# Patient Record
Sex: Female | Born: 2000 | Race: White | Hispanic: No | Marital: Single | Smoking: Never smoker
Health system: Southern US, Community
[De-identification: ages and names within clinical notes are randomized; demographics above are authoritative.]

## PROBLEM LIST (undated history)

## (undated) DIAGNOSIS — L509 Urticaria, unspecified: Secondary | ICD-10-CM

## (undated) DIAGNOSIS — N200 Calculus of kidney: Secondary | ICD-10-CM

## (undated) DIAGNOSIS — F411 Generalized anxiety disorder: Secondary | ICD-10-CM

## (undated) DIAGNOSIS — H9193 Unspecified hearing loss, bilateral: Secondary | ICD-10-CM

## (undated) DIAGNOSIS — H903 Sensorineural hearing loss, bilateral: Secondary | ICD-10-CM

## (undated) DIAGNOSIS — F909 Attention-deficit hyperactivity disorder, unspecified type: Secondary | ICD-10-CM

## (undated) HISTORY — DX: Calculus of kidney: N20.0

## (undated) HISTORY — DX: Unspecified hearing loss, bilateral: H91.93

## (undated) HISTORY — DX: Urticaria, unspecified: L50.9

## (undated) HISTORY — DX: Generalized anxiety disorder: F41.1

## (undated) HISTORY — DX: Attention-deficit hyperactivity disorder, unspecified type: F90.9

## (undated) HISTORY — DX: Sensorineural hearing loss, bilateral: H90.3

---
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
## 2021-03-11 NOTE — Progress Notes (Signed)
Rebekah Parker is a 20 y.o. female here for lower back pain.  History of Present Illness:   Chief Complaint  Patient presents with  . Back Pain    Lower right sided back pain that started 5 days ago Has a hx of kidney infections     HPI  Back Pain Rebekah Parker presents with concerns of a kidney infection following lower right sided back pain. According to pt, the pain begins in the lower right side of her back and radiates to the right side of her pelvis. This pain has been onset for five days and is worsened by movement or laying down. Rebekah Parker says she has been pushing more fluids and taking NSAIDs such as advil which has provided no relief.   Pt does have a hx of kidney infections, having two in the past. She expressed that she is willing to undergo a CT scan if deemed necessary. Denies frequent urination, burning with urination, vaginal discharge, hematuria,  fever, chills, diarrhea, or recent sexual encounter.   Past Medical History:  Diagnosis Date  . ADHD   . Bilateral hearing loss 06/16/2018  . GAD (generalized anxiety disorder) 06/16/2018  . Hearing impaired person, bilateral   . Sensorineural hearing loss (SNHL) of both ears 06/16/2018   genetic     Social History   Tobacco Use  . Smoking status: Never  . Smokeless tobacco: Never  Vaping Use  . Vaping Use: Never used  Substance Use Topics  . Alcohol use: Never  . Drug use: Never    History reviewed. No pertinent surgical history.  Family History  Problem Relation Age of Onset  . Asthma Mother   . Miscarriages / [place] Mother   . Hypertension Father   . Asthma Maternal Grandmother   . Hearing loss Maternal Grandfather   . Heart attack Maternal Grandfather   . Hyperlipidemia Maternal Grandfather   . Stroke Maternal Grandfather   . Diabetes Paternal Grandmother   . Hearing loss Paternal Grandmother     No Known Allergies  Current Medications:   Current Outpatient Medications:  .   amoxicillin-clavulanate (AUGMENTIN) 875-125 MG tablet, Take 1 tablet by mouth 2 (two) times daily., Disp: 20 tablet, Rfl: 0 .  Amphet-Dextroamphet 3-Bead ER 25 MG CP24, TAKE 1 CAPSULE BY MOUTH EVERY DAY IN THE MORNING, Disp: , Rfl:  .  sertraline (ZOLOFT) 50 MG tablet, Take by mouth., Disp: , Rfl:    Review of Systems:   ROS Negative unless otherwise specified per HPI.  Vitals:   Vitals:   03/12/21 0850  BP: 108/72  Pulse: 89  Temp: 97.9 F (36.6 C)  TempSrc: Temporal  SpO2: 99%  Weight: 171 lb 2 oz (77.6 kg)  Height: 5' (1.524 m)     Body mass index is 33.42 kg/m.  Physical Exam:   Physical Exam Vitals and nursing note reviewed.  Constitutional:      General: She is not in acute distress.    Appearance: She is well-developed. She is not ill-appearing or toxic-appearing.  Cardiovascular:     Rate and Rhythm: Normal rate and regular rhythm.     Pulses: Normal pulses.     Heart sounds: Normal heart sounds, S1 normal and S2 normal.  Pulmonary:     Effort: Pulmonary effort is normal.     Breath sounds: Normal breath sounds.  Abdominal:     General: Abdomen is flat. Bowel sounds are normal.     Palpations: Abdomen is soft.  Tenderness: There is abdominal tenderness in the right lower quadrant. There is right CVA tenderness. There is no guarding or rebound.  Skin:    General: Skin is warm and dry.  Neurological:     Mental Status: She is alert.     GCS: GCS eye subscore is 4. GCS verbal subscore is 5. GCS motor subscore is 6.  Psychiatric:        Speech: Speech normal.        Behavior: Behavior normal. Behavior is cooperative.   Results for orders placed or performed in visit on 03/12/21  POCT urinalysis dipstick  Result Value Ref Range   Color, UA yellow    Clarity, UA cloudy    Glucose, UA Negative Negative   Bilirubin, UA neg    Ketones, UA neg    Spec Grav, UA 1.025 1.010 - 1.025   Blood, UA neg    pH, UA 6.0 5.0 - 8.0   Protein, UA Negative Negative    Urobilinogen, UA 0.2 0.2 or 1.0 E.U./dL   Nitrite, UA neg    Leukocytes, UA Trace (A) Negative   Appearance     Odor    POCT urine pregnancy  Result Value Ref Range   Preg Test, Ur Negative Negative     Assessment and Plan:   Right-sided low back pain without sciatica, unspecified chronicity Urine pregnancy test negative UA shows leukocytes Given hx of pyelo and sx consistent with this -- will treat with rocephin injection (1 g injection today, patient tolerated well) Start oral augmentin (did not have good results with cipro in the past per patient report -- I do not have prior culture results to review) Patient and mom are asking for CT Renal Stone study per patient's request Follow-up based on results and clinical symptoms, worsening precautions advised  I,Havlyn C Ratchford,acting as a scribe for [employer], PA.,have documented all relevant documentation on the behalf of Rebekah Coke, PA,as directed by  Rebekah Coke, PA while in the presence of Rebekah Parker, [place].  I, Rebekah Parker, [place], have reviewed all documentation for this visit. The documentation on 03/12/21 for the exam, diagnosis, procedures, and orders are all accurate and complete.  Rebekah Coke, PA-C

## 2021-03-12 ENCOUNTER — Encounter (HOSPITAL_BASED_OUTPATIENT_CLINIC_OR_DEPARTMENT_OTHER): Payer: Self-pay

## 2021-03-12 ENCOUNTER — Other Ambulatory Visit: Payer: Self-pay

## 2021-03-12 ENCOUNTER — Ambulatory Visit (HOSPITAL_BASED_OUTPATIENT_CLINIC_OR_DEPARTMENT_OTHER)
Admission: RE | Admit: 2021-03-12 | Discharge: 2021-03-12 | Disposition: A | Discharge status: Home / Self Care | Payer: 59 | Source: Ambulatory Visit | Attending: Physician Assistant | Admitting: Physician Assistant

## 2021-03-12 ENCOUNTER — Ambulatory Visit: Payer: 59 | Admitting: Physician Assistant

## 2021-03-12 ENCOUNTER — Encounter: Payer: Self-pay | Admitting: Physician Assistant

## 2021-03-12 ENCOUNTER — Other Ambulatory Visit: Payer: Self-pay | Admitting: *Deleted

## 2021-03-12 VITALS — BP 108/72 | HR 89 | Temp 97.9°F | Ht 60.0 in | Wt 171.1 lb

## 2021-03-12 DIAGNOSIS — R1031 Right lower quadrant pain: Secondary | ICD-10-CM

## 2021-03-12 DIAGNOSIS — M545 Low back pain, unspecified: Secondary | ICD-10-CM | POA: Diagnosis not present

## 2021-03-12 DIAGNOSIS — N200 Calculus of kidney: Secondary | ICD-10-CM

## 2021-03-12 LAB — CBC WITH DIFFERENTIAL/PLATELET
Basophils Absolute: 0 10*3/uL (ref 0.0–0.1)
Basophils Relative: 0.4 % (ref 0.0–3.0)
Eosinophils Absolute: 0.1 10*3/uL (ref 0.0–0.7)
Eosinophils Relative: 1.2 % (ref 0.0–5.0)
HCT: 36.6 % (ref 36.0–49.0)
Hemoglobin: 12.4 g/dL (ref 12.0–16.0)
Lymphocytes Relative: 33.2 % (ref 24.0–48.0)
Lymphs Abs: 2.1 10*3/uL (ref 0.7–4.0)
MCHC: 34 g/dL (ref 31.0–37.0)
MCV: 88.4 fl (ref 78.0–98.0)
Monocytes Absolute: 0.7 10*3/uL (ref 0.1–1.0)
Monocytes Relative: 10.6 % (ref 3.0–12.0)
Neutro Abs: 3.4 10*3/uL (ref 1.4–7.7)
Neutrophils Relative %: 54.6 % (ref 43.0–71.0)
Platelets: 199 10*3/uL (ref 150.0–575.0)
RBC: 4.13 Mil/uL (ref 3.80–5.70)
RDW: 13.5 % (ref 11.4–15.5)
WBC: 6.2 10*3/uL (ref 4.5–13.5)

## 2021-03-12 LAB — POCT URINALYSIS DIPSTICK
Bilirubin, UA: NEGATIVE
Blood, UA: NEGATIVE
Glucose, UA: NEGATIVE
Ketones, UA: NEGATIVE
Nitrite, UA: NEGATIVE
Protein, UA: NEGATIVE
Spec Grav, UA: 1.025 (ref 1.010–1.025)
Urobilinogen, UA: 0.2 E.U./dL
pH, UA: 6 (ref 5.0–8.0)

## 2021-03-12 LAB — BASIC METABOLIC PANEL
BUN: 11 mg/dL (ref 6–23)
CO2: 23 mEq/L (ref 19–32)
Calcium: 9.6 mg/dL (ref 8.4–10.5)
Chloride: 106 mEq/L (ref 96–112)
Creatinine, Ser: 0.72 mg/dL (ref 0.40–1.20)
GFR: 121.62 mL/min (ref >60.00)
Glucose, Bld: 89 mg/dL (ref 70–99)
Potassium: 4.1 mEq/L (ref 3.5–5.1)
Sodium: 136 mEq/L (ref 135–145)

## 2021-03-12 LAB — POCT URINE PREGNANCY: Preg Test, Ur: NEGATIVE

## 2021-03-12 MED ORDER — AMOXICILLIN-POT CLAVULANATE 875-125 MG PO TABS: 1.0000 | ORAL_TABLET | Freq: Two times a day (BID) | ORAL | 0 refills | Status: DC

## 2021-03-12 MED ORDER — CEFTRIAXONE SODIUM 1 G IJ SOLR
1.0000 g | Freq: Once | INTRAMUSCULAR | Status: AC
  Administered 2021-03-12: 1 g via INTRAMUSCULAR

## 2021-03-12 NOTE — Addendum Note (Signed)
Addended by: Jobe Gibbon on: 03/12/2021 11:41 AM   Modules accepted: Orders

## 2021-03-12 NOTE — Patient Instructions (Signed)
It was great to see you!  Rocephin injection today Augmentin oral antibiotic -- please start this  We are going to obtain CT scan for further evaluation  General instructions Make sure you: Pee until your bladder is empty. Do not hold pee for a long time. Empty your bladder after sex. Wipe from front to back after pooping if you are a female. Use each tissue one time when you wipe. Drink enough fluid to keep your pee pale yellow. Keep all follow-up visits as told by your doctor. This is important. Contact a doctor if: You do not get better after 1-2 days. Your symptoms go away and then come back. Get help right away if: You have very bad back pain. You have very bad pain in your lower belly. You have a fever. You are sick to your stomach (nauseous). You are throwing up.   Take care,  Jarold Motto PA-C

## 2021-03-13 LAB — URINE CULTURE
MICRO NUMBER:: 12726069
SPECIMEN QUALITY:: ADEQUATE

## 2021-05-22 ENCOUNTER — Ambulatory Visit (INDEPENDENT_AMBULATORY_CARE_PROVIDER_SITE_OTHER): Payer: 59 | Admitting: Family Medicine

## 2021-05-22 ENCOUNTER — Other Ambulatory Visit: Payer: Self-pay

## 2021-05-22 ENCOUNTER — Encounter: Payer: Self-pay | Admitting: Family Medicine

## 2021-05-22 VITALS — BP 119/80 | HR 43 | Temp 98.0°F | Ht 63.0 in | Wt 166.0 lb

## 2021-05-22 DIAGNOSIS — F411 Generalized anxiety disorder: Secondary | ICD-10-CM | POA: Diagnosis not present

## 2021-05-22 DIAGNOSIS — Z7185 Encounter for immunization safety counseling: Secondary | ICD-10-CM

## 2021-05-22 DIAGNOSIS — N200 Calculus of kidney: Secondary | ICD-10-CM | POA: Diagnosis not present

## 2021-05-22 DIAGNOSIS — F902 Attention-deficit hyperactivity disorder, combined type: Secondary | ICD-10-CM

## 2021-05-22 DIAGNOSIS — N390 Urinary tract infection, site not specified: Secondary | ICD-10-CM

## 2021-05-22 DIAGNOSIS — Z0000 Encounter for general adult medical examination without abnormal findings: Secondary | ICD-10-CM | POA: Diagnosis not present

## 2021-05-22 DIAGNOSIS — N920 Excessive and frequent menstruation with regular cycle: Secondary | ICD-10-CM

## 2021-05-22 NOTE — Progress Notes (Signed)
Subjective  Chief Complaint  Patient presents with   Annual Exam    Fasting    HPI: Rebekah Parker is a 21 y.o. female who presents to [facility] today for a Female Wellness Visit.   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  Living at home.  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  Dating but not yet sexually active ever.  Regular heavy menstrual cycles.  Recent CBC without anemia. No high risk behaviors identified. ADD and GAD: Continues with [facility] attention specialist and psychiatry.  Adjusting medications.  No longer on sertraline, change recently to Wellbutrin.  Fairly stable. Reviewed recent office visit for flank pain and urology visit for recurrent UTIs.  CT scan showed nonobstructing small nephrolithiasis.  Treated for UTI, recurrent and now on Macrobid suppression.  Has follow-up in 6 months.  Assessment  1. Annual physical exam   2. GAD (generalized anxiety disorder)   3. Attention deficit hyperactivity disorder (ADHD), combined type   4. Recurrent UTI   5. Nephrolithiasis      Plan  Female Wellness Visit: Age appropriate Health Maintenance and Prevention measures were discussed with patient. Included topics are cancer screening recommendations, ways to keep healthy (see AVS) including dietary and exercise recommendations, regular eye and dental care, use of seat belts, and avoidance of moderate alcohol use and tobacco use.  BMI: discussed patient's BMI and encouraged positive lifestyle modifications to help get to or maintain a target BMI. HM needs and immunizations were addressed and ordered. See below for orders. See HM and immunization section for updates.  Counseling done regarding recommendations for HPV vaccinations and Bexsero.  Patient will discuss with her mother. Routine labs  and screening tests ordered including cmp, cbc and lipids where appropriate. Discussed recommendations regarding Vit D and calcium supplementation (see AVS) Heavy menstrual cycles at risk for anemia.  Start 3 times weekly iron if tolerates.Pretreat with Advil, monitor Continue Macrobid for recurrent UTI prophylaxis Nonsymptomatic nephrolithiasis, education given   Follow up: Recheck 12 months  Body mass index is 29.41 kg/m. Wt Readings from Last 3 Encounters:  05/22/21 166 lb (75.3 kg) (91 %, Z= 1.33)*  03/12/21 171 lb 2 oz (77.6 kg) (93 %, Z= 1.46)*  01/23/20 159 lb 12.8 oz (72.5 kg) (90 %, Z= 1.28)*   * Growth percentiles are based on CDC (Girls, 2-20 Years) data.   Need for contraception: No,   Patient Active Problem List   Diagnosis Date Noted   ADHD 06/16/2018    [employer] Attention Health specialist    Auditory processing disorder 06/16/2018    Genetic - father has samel dxd in 7th grade.     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    Genetic; [name] audiologist, 06/2018    Health Maintenance  Topic Date Due   HPV VACCINES (1 - 2-dose series) Never done   INFLUENZA VACCINE  07/04/2021 (Originally 11/04/2020)   Hepatitis C Screening  05/22/2022 (Originally 04/04/2020)   HIV Screening  05/22/2022 (Originally 04/04/2017)   TETANUS/TDAP  11/16/2023   Immunization History  Administered Date(s) Administered   DTaP 06/14/2002, 08/04/2002, 10/05/2002, 05/16/2003, 05/18/2006   Hepatitis B 06/04/01, 05/08/2002, 01/05/2003   HiB (PRP-OMP) 06/14/2002, 08/04/2002, 10/05/2002, 05/16/2003   IPV 06/14/2002, 07/25/2002, 01/05/2003, 05/18/2006   MMR 05/16/2003,  05/18/2006   Meningococcal Conjugate 11/15/2013, 11/15/2013   Meningococcal Mcv4o 02/21/2020   Pneumococcal Conjugate-13 06/14/2002, 08/04/2002, 01/05/2003, 05/16/2003   Tdap 11/15/2013   Varicella 11/07/2003, 12/17/2003, 05/18/2006   We updated  and reviewed the patient's past history in detail and it is documented below. Allergies: Patient has No Known Allergies. Past Medical History Patient  has a past medical history of ADHD, Bilateral hearing loss (06/16/2018), GAD (generalized anxiety disorder) (06/16/2018), Hearing impaired person, bilateral, Kidney stones, and Sensorineural hearing loss (SNHL) of both ears (06/16/2018). Past Surgical History Patient  has no past surgical history on file. Family History: Patient family history includes Asthma in her maternal grandmother and mother; Diabetes in her paternal grandmother; Hearing loss in her maternal grandfather and paternal grandmother; Heart attack in her maternal grandfather; Hyperlipidemia in her maternal grandfather; Hypertension in her father; Miscarriages / [place] in her mother; Stroke in her maternal grandfather. Social History:  Patient  reports that she has never smoked. She has never used smokeless tobacco. She reports that she does not drink alcohol and does not use drugs.  Review of Systems: Constitutional: negative for fever or malaise Ophthalmic: negative for photophobia, double vision or loss of vision Cardiovascular: negative for chest pain, dyspnea on exertion, or new LE swelling Respiratory: negative for SOB or persistent cough Gastrointestinal: negative for abdominal pain, change in bowel habits or melena Genitourinary: negative for dysuria or gross hematuria, no abnormal uterine bleeding or disharge Musculoskeletal: negative for new gait disturbance or muscular weakness Integumentary: negative for new or persistent rashes, no breast lumps Neurological: negative for TIA or stroke symptoms Psychiatric: negative for SI or delusions Allergic/Immunologic: negative for hives Patient Care Team    Relationship Specialty Notifications Start End  Leamon Arnt, MD PCP - General Family Medicine  06/08/18   Arlana Pouch, MD Referring Physician Pediatrics   06/08/18     Objective  Vitals: BP 119/80    Pulse (!) 43    Temp 98 F (36.7 C) (Temporal)    Ht '5\' 3"'  (1.6 m)    Wt 166 lb (75.3 kg)    LMP 05/17/2021    SpO2 98%    BMI 29.41 kg/m  General:  Well developed, well nourished, no acute distress  Psych:  Alert and orientedx3,normal mood and affect HEENT:  Normocephalic, atraumatic, non-icteric sclera, supple neck without adenopathy, mass or thyromegaly Cardiovascular:  Normal S1, S2, RRR without gallop, rub or murmur Respiratory:  Good breath sounds bilaterally, CTAB with normal respiratory effort Gastrointestinal: normal bowel sounds, soft, non-tender, no noted masses. No HSM, no suprapubic tenderness. MSK: no deformities, contusions. Joints are without erythema or swelling. Spine and CVA region are nontender Skin:  Warm, no rashes or suspicious lesions noted Neurologic:    Mental status is normal. Gross motor and sensory exams are normal. Normal gait. No tremor   Commons side effects, risks, benefits, and alternatives for medications and treatment plan prescribed today were discussed, and the patient expressed understanding of the given instructions. Patient is instructed to call or message via MyChart if he/she has any questions or concerns regarding our treatment plan. No barriers to understanding were identified. We discussed Red Flag symptoms and signs in detail. Patient expressed understanding regarding what to do in case of urgent or emergency type symptoms.  Medication list was reconciled, printed and provided to the patient in AVS. Patient instructions and summary information was reviewed with the patient as documented in the AVS. This note was prepared with assistance of Dragon  voice recognition software. Occasional wrong-word or sound-a-like substitutions may have occurred due to the inherent limitations of voice recognition software  This visit occurred during the SARS-CoV-2 public health emergency.  Safety protocols were in place,  including screening questions prior to the visit, additional usage of staff PPE, and extensive cleaning of exam room while observing appropriate contact time as indicated for disinfecting solutions.

## 2021-05-22 NOTE — Patient Instructions (Signed)
Please return in 12 months for your annual complete physical; please come fasting.   You are currently not anemic. Start an iron pill daily or 3x/ week depending upon how you tolerate it.   I recommend you get the HPV and Meningitis B vaccinations to protect you from cervical cancer and meningitis B. Please schedule a nurse visit if you and your mother would like to move forward with these.   If you have any questions or concerns, please don't hesitate to send me a message via MyChart or call the office at [phone]. Thank you for visiting with [place] today! It's our pleasure caring for you.   Please do these things to maintain good health!  Exercise at least 30-45 minutes a day,  4-5 days a week.  Eat a low-fat diet with lots of fruits and vegetables, up to 7-9 servings per day. Drink plenty of water daily. Try to drink 8 8oz glasses per day. Seatbelts can save your life. Always wear your seatbelt. Place Smoke Detectors on every level of your home and check batteries every year. Schedule an appointment with an eye doctor for an eye exam every 1-2 years Safe sex - use condoms to protect yourself from STDs if you could be exposed to these types of infections. Use birth control if you do not want to become pregnant and are sexually active. Avoid heavy alcohol use. If you drink, keep it to less than 2 drinks/day and not every day. Health Care Power of Attorney.  Choose someone you trust that could speak for you if you became unable to speak for yourself. Depression is common in our stressful world.If you're feeling down or losing interest in things you normally enjoy, please come in for a visit. If anyone is threatening or hurting you, please get help. Physical or Emotional Violence is never OK.

## 2021-12-29 ENCOUNTER — Encounter: Payer: Self-pay | Admitting: *Deleted

## 2022-03-19 ENCOUNTER — Encounter: Payer: Self-pay | Admitting: *Deleted

## 2022-06-08 ENCOUNTER — Ambulatory Visit (INDEPENDENT_AMBULATORY_CARE_PROVIDER_SITE_OTHER): Payer: 59 | Admitting: Family Medicine

## 2022-06-08 VITALS — BP 100/70 | HR 91 | Temp 98.3°F | Ht 63.0 in | Wt 167.2 lb

## 2022-06-08 DIAGNOSIS — L509 Urticaria, unspecified: Secondary | ICD-10-CM | POA: Diagnosis not present

## 2022-06-08 NOTE — Progress Notes (Signed)
Subjective  CC:  Chief Complaint  Patient presents with   Rash    Pt stated that she has been breaking out in a rash for the past 63mo. Face/arms mostly but leg as well    HPI: Rebekah NEALEYis a 22y.o. female who presents to the office today to address the problems listed above in the chief complaint. 22year old healthy female reports 452-monthistory of intermittent urticaria.  She believes she is allergic to red dye in foods.  Example, she will break out in hives after her favorite ice cream, red gummy bears, red flavored drinks etc.  She has not noted any pattern to her hives.  Hives are diffuse and generalized but she denies symptoms of angioedema.  She has no hives currently but she did have right arm hives yesterday.  Prior to 4 months ago she never had this problem.  No allergies or asthma.  She takes Benadryl and this helps.  She has not tried any other medications Review of systems negative for shortness of breath or wheezing  Assessment  1. 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  Will start treatment with Pepcid 20 twice daily and Zyrtec 10 daily.  May add Benadryl if needed.  Refer to allergist for further testing.  Patient declines blood work or further evaluation here in the office today.  Follow up: For complete physical Visit date not found  Orders Placed This Encounter  Procedures   Ambulatory referral to Allergy   No orders of the defined types were placed in this encounter.     I reviewed the patients updated PMH, FH, and SocHx.    Patient Active Problem List   Diagnosis Date Noted   Menorrhagia with regular cycle 05/22/2021   ADHD 06/16/2018   Auditory processing disorder 06/16/2018   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   No outpatient medications have been marked as taking for the 06/08/22 encounter (Office Visit) with AnLeamon ArntMD.     Allergies: Patient has No Known Allergies. Family History: Patient family history includes Asthma in her maternal grandmother and mother; Diabetes in her paternal grandmother; Hearing loss in her maternal grandfather and paternal grandmother; Heart attack in her maternal grandfather; Hyperlipidemia in her maternal grandfather; Hypertension in her father; Miscarriages / [place] her mother; Stroke in her maternal grandfather. Social History:  Patient  reports that she has never smoked. She has never used smokeless tobacco. She reports that she does not drink alcohol and does not use drugs.  Review of Systems: Constitutional: Negative for fever malaise or anorexia Cardiovascular: negative for chest pain Respiratory: negative for SOB or persistent cough Gastrointestinal: negative for abdominal pain  Objective  Vitals: BP 100/70   Pulse 91   Temp 98.3 F (36.8 C)   Ht '5\' 3"'$  (1.6 m)   Wt 167 lb 3.2 oz (75.8 kg)   SpO2 99%   BMI 29.62 kg/m  General: no acute distress , A&Ox3 Pictures show urticaria on arms Commons side effects, risks, benefits, and alternatives for medications and treatment plan prescribed today were discussed, and the patient expressed understanding of the given instructions. Patient is instructed to call or message via MyChart if he/she has any questions or concerns regarding our treatment plan. No barriers to understanding were identified. We discussed Red Flag symptoms and signs in detail. Patient expressed understanding regarding what to do in case of  urgent or emergency type symptoms.  Medication list was reconciled, printed and provided to the patient in AVS. Patient instructions and summary information was reviewed with the patient as documented in the AVS. This note was prepared with assistance of Dragon voice recognition software. Occasional wrong-word or sound-a-like substitutions may have occurred due to the inherent limitations of voice recognition  software

## 2022-06-08 NOTE — Patient Instructions (Signed)
Please return for your complete physical at your convenience.   Please start otc pepcid '20mg'$  twice a day and zyrtec '10mg'$  nightly. These medications should calm down your allergic reactions. You may continue benadryl if needed.  If you have any questions or concerns, please don't hesitate to send me a message via MyChart or call the office at [phone]. Thank you for visiting with [place] today! It's our pleasure caring for you.   Hives Hives (urticaria) are itchy, red, swollen areas of skin. They can show up on any part of the body. They often fade within 24 hours (acute hives). If you get new hives after the old ones fade and the cycle goes on for many days or weeks, it is called chronic hives. Hives do not spread from person to person (are not contagious). Hives can happen when your body reacts to something you are allergic to (allergen) or to something that irritates your skin. When you are exposed to something that triggers hives, your body releases a chemical called histamine. This causes redness, itching, and swelling. Hives can show up right after you are exposed to a trigger or hours later. What are the causes? Hives may be caused by: Food allergies. Insect bites or stings. Allergies to pollen or pets. Spending time in sunlight, heat, or cold (exposure). Exercise. Stress. You can also get hives from other conditions and treatments. These include: Viruses, such as the common cold. Bacterial infections, such as urinary tract infections and strep throat. Certain medicines. Contact with latex or chemicals. Allergy shots. Blood transfusions. In some cases, the cause of hives is not known (idiopathic hives). What increases the risk? You are more likely to get hives if: You are female. You have food allergies. Hives are more common if you are allergic to citrus fruits, milk, eggs, peanuts, tree nuts, or shellfish. You are allergic to: Medicines. Latex. Insects. Animals. Pollen. What  are the signs or symptoms? Common symptoms of hives include raised, itchy, red or white bumps or patches on your skin. These areas may: Become large and swollen (welts). Quickly change shape and location. This may happen more than once. Be separate hives or connect over a large area of skin. Sting or become painful. Turn white when pressed in the center (blanch). In severe cases, your hands, feet, and face may also become swollen. This may happen if hives form deeper in your skin. How is this diagnosed? Hives may be diagnosed based on your symptoms, medical history, and a physical exam. You may have skin, pee (urine), or blood tests done. These can help find out what is causing your hives and rule out other health issues. You may also have a biopsy done. This is when a small piece of skin is removed for testing. How is this treated? Treatment for hives depends on the cause and on how severe your symptoms are. You may be told to use cool, wet cloths (cool compresses) or to take cool showers to relieve itching. Treatment may also include: Medicines to help: Relieve itching (antihistamines). Reduce swelling (corticosteroids). Treat infection (antibiotics). An injectable medicine called omalizumab. You may need this if you have chronic idiopathic hives and still have symptoms even after you are treated with antihistamines. In severe cases, you may need to use a device filled with medicine that gives an emergency shot of epinephrine (auto-injector pen) to prevent a very bad allergic reaction (anaphylactic reaction). Follow these instructions at home: Medicines Take and apply over-the-counter and prescription medicines only as  told by your health care provider. If you were prescribed antibiotics, take them as told by your provider. Do not stop using the antibiotic even if you start to feel better. Skin care Apply cool compresses to the affected areas. Do not scratch or rub your skin. General  instructions Do not take hot showers or baths. This can make itching worse. Do not wear tight-fitting clothing. Use sunscreen. Wear protective clothing when you are outside. Avoid anything that causes your hives. Keep a journal to help track what causes your hives. Write down: What medicines you take. What you eat and drink. What products you use on your skin. Keep all follow-up visits. Your provider will track how well treatment is working. Contact a health care provider if: Your symptoms do not get better with medicine. Your joints are painful or swollen. You have a fever. You have pain in your abdomen. Get help right away if: Your tongue, lips, or eyelids swell. Your chest or throat feels tight. You have trouble breathing or swallowing. These symptoms may be an emergency. Use the auto-injector pen right away. Then call 911. Do not wait to see if the symptoms will go away. Do not drive yourself to the hospital. This information is not intended to replace advice given to you by your health care provider. Make sure you discuss any questions you have with your health care provider. Document Revised: 12/18/2021 Document Reviewed: 12/09/2021 Elsevier Patient Education  [place].

## 2022-07-30 ENCOUNTER — Encounter: Payer: Self-pay | Admitting: Allergy

## 2022-07-30 ENCOUNTER — Ambulatory Visit: Payer: 59 | Admitting: Allergy

## 2022-07-30 ENCOUNTER — Other Ambulatory Visit: Payer: Self-pay

## 2022-07-30 VITALS — BP 102/66 | HR 77 | Temp 98.3°F | Resp 16 | Ht 63.0 in | Wt 171.7 lb

## 2022-07-30 DIAGNOSIS — L508 Other urticaria: Secondary | ICD-10-CM | POA: Diagnosis not present

## 2022-07-30 DIAGNOSIS — L299 Pruritus, unspecified: Secondary | ICD-10-CM | POA: Diagnosis not present

## 2022-07-30 NOTE — Patient Instructions (Signed)
Hives, chronic Itching  - at this time etiology of hives is unknown but most likely spontaneous in nature.  Hives can be caused by a variety of different triggers including illness/infection, foods, medications, stings, exercise, pressure, vibrations, extremes of temperature to name a few however majority of the time there is no identifiable trigger.  Your symptoms have been ongoing for >6 weeks making this chronic thus will obtain labwork to evaluate: CBC w diff, CMP, tryptase, hive panel, environmental panel, red and yellow dye IgE  - start taking Zyrtec 1 tab daily with Pepcid 1 tab daily at this time.  If daily dosing is not effective enough then may need to increase dosing.  If antihistamine regimen is not effective then would recommend Xolair monthly injections for chronic spontaneous hives.  Will discuss this therapy in more detail in future if needed.  - should significant symptoms recur or new symptoms occur, a journal is to be kept recording any foods eaten, beverages consumed, medications taken, activities performed, and environmental conditions within a 6 hour time period prior to the onset of symptoms.   Follow-up in 2-3 months or sooner if needed

## 2022-07-30 NOTE — Progress Notes (Signed)
New Patient Note  RE: Rebekah Parker MRN: [id number] DOB: [birth year] Date of Office Visit: 07/30/2022   Primary care provider: Willow Ora, MD  Chief Complaint: Hives  History of present illness: Rebekah Parker is a 22 y.o. female presenting today for evaluation of urticaria.  She reports having hives that started about 5 months ago.  Never had hives before this. She has has the hives on face and arms mostly.  It is itchy.  Hives at this time are daily.  She states initially the hives were easier to handle but the welts are getting bigger and harder to manage.  No swelling. No joints aches/pains. No fevers.  Denies preceding illness.  She states she thought the hives were triggered by red and yellow dye foods.  However she states now anything she eats she can have a rash.  No change in soaps/lotion/body products.  No concern for bites/stings.  Hives do not leave any bruising.  Hives can last 1-2 hours.  If she takes benadryl it'll resolve in 20 minutes or so. She takes benadryl thus far for the rash and tries not to take it every day.  She is in cosmetology school.   She doesn't eat red meat in the diet and she is picky eater.  [place]   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   She states her mother and maternal grandmother both have kidney issues.  She states she did change her medication (antibiotics) for her kidney stones with her urologist but none of these changes mattered as she kept having hives.  Review of systems: Review of Systems  Constitutional: Negative.   HENT: Negative.    Eyes: Negative.   Respiratory: Negative.    Cardiovascular: Negative.   Gastrointestinal: Negative.   Musculoskeletal: Negative.   Skin:  Positive for rash.  Allergic/Immunologic: Negative.   Neurological: Negative.     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 No pertinent surgical history.  Family history:  Family History  Problem Relation Age of Onset   Asthma Mother    Miscarriages / Stillbirths Mother    Hypertension Father    Asthma Maternal Grandmother    Hearing loss Maternal Grandfather    Heart attack Maternal Grandfather    Hyperlipidemia Maternal Grandfather    Stroke Maternal Grandfather    Diabetes Paternal Grandmother    Hearing loss Paternal Grandmother     Social history: Lives in a home without carpeting with electric heating and central cooling.  2 dogs in the home.  There is no concern for water damage, mildew or roaches in the home.  She denies a smoking history.   Medication List: Current Outpatient Medications  Medication Sig Dispense Refill   buPROPion (WELLBUTRIN XL) 150 MG 24 hr tablet Take 150 mg by mouth daily.     MYDAYIS 12.5 MG CP24 Take 1 capsule by mouth daily.     nitrofurantoin, macrocrystal-monohydrate, (MACROBID) 100 MG capsule Take 100 mg by mouth daily.     Amphet-Dextroamphet  3-Bead ER 25 MG CP24      No current facility-administered medications for this visit.    Known medication allergies: No Known Allergies   Physical examination: Blood pressure 102/66, pulse 77, temperature 98.3 F (36.8 C), temperature source Axillary, resp. rate 16, height  (1.6 m), weight 171 lb 11.2 oz (77.9 kg), SpO2 100 %.  General: Alert, interactive, in no acute distress. HEENT: PERRLA, TMs pearly gray, turbinates non-edematous without discharge, post-pharynx non erythematous. Neck: Supple without lymphadenopathy. Lungs: Clear to auscultation without wheezing, rhonchi or rales. {no increased work of  breathing. CV: Normal S1, S2 without murmurs. Abdomen: Nondistended, nontender. Skin: Scattered erythematous urticarial type lesions primarily located bilateral arms , nonvesicular. Extremities:  No clubbing, cyanosis or edema. Neuro:   Grossly intact.  Diagnositics/Labs: Labs: Most recent BMP in the EMR system: Component     Latest Ref Rng 03/12/2021  Sodium     135 - 145 mEq/L 136   Potassium     3.5 - 5.1 mEq/L 4.1   Chloride     96 - 112 mEq/L 106   CO2     19 - 32 mEq/L 23   Glucose     70 - 99 mg/dL 89   BUN     6 - 23 mg/dL 11   Creatinine     1.61 - 1.20 mg/dL 0.96   GFR     >04.54 mL/min 121.62   Calcium     8.4 - 10.5 mg/dL 9.6      Assessment and plan:   Chronic urticaria Pruritus  - at this time etiology of hives is unknown but most likely spontaneous in nature.  Hives can be caused by a variety of different triggers including illness/infection, foods, medications, stings, exercise, pressure, vibrations, extremes of temperature to name a few however majority of the time there is no identifiable trigger.  Your symptoms have been ongoing for >6 weeks making this chronic thus will obtain labwork to evaluate: CBC w diff, CMP, tryptase, hive panel, environmental panel, red and yellow dye IgE  - start taking Zyrtec 1 tab daily with Pepcid 1 tab daily at this time.  If daily dosing is not effective enough then may need to increase dosing.  Will see where her current creatinine level is but it looks like she has had normal kidney functions in the past. If antihistamine regimen is not effective then would recommend Xolair monthly injections for chronic spontaneous hives.  Will discuss this therapy in more detail in future if needed.  - should significant symptoms recur or new symptoms occur, a journal is to be kept recording any foods eaten, beverages consumed, medications taken, activities performed, and environmental conditions within a 6 hour time period prior to the  onset of symptoms.   Follow-up in 2-3 months or sooner if needed  I appreciate the opportunity to take part in Fransisca's care. Please do not hesitate to contact me with questions.  Sincerely,   Margo Aye, MD Allergy/Immunology [facility]

## 2022-08-05 LAB — COMPREHENSIVE METABOLIC PANEL
ALT: 31 IU/L (ref 0–32)
AST: 25 IU/L (ref 0–40)
Albumin/Globulin Ratio: 1.8 (ref 1.2–2.2)
Albumin: 4.7 g/dL (ref 4.0–5.0)
Alkaline Phosphatase: 71 IU/L (ref 42–106)
BUN/Creatinine Ratio: 16 (ref 9–23)
BUN: 10 mg/dL (ref 6–20)
Bilirubin Total: 0.2 mg/dL (ref 0.0–1.2)
CO2: 16 mmol/L — ABNORMAL LOW (ref 20–29)
Calcium: 10.5 mg/dL — ABNORMAL HIGH (ref 8.7–10.2)
Chloride: 105 mmol/L (ref 96–106)
Creatinine, Ser: 0.64 mg/dL (ref 0.57–1.00)
Globulin, Total: 2.6 g/dL (ref 1.5–4.5)
Glucose: 75 mg/dL (ref 70–99)
Potassium: 4.2 mmol/L (ref 3.5–5.2)
Sodium: 140 mmol/L (ref 134–144)
Total Protein: 7.3 g/dL (ref 6.0–8.5)
eGFR: 130 mL/min/{1.73_m2} (ref >59)

## 2022-08-05 LAB — CBC WITH DIFFERENTIAL
Basophils Absolute: 0 10*3/uL (ref 0.0–0.2)
Basos: 1 %
EOS (ABSOLUTE): 0.1 10*3/uL (ref 0.0–0.4)
Eos: 2 %
Hematocrit: 39 % (ref 34.0–46.6)
Hemoglobin: 13.2 g/dL (ref 11.1–15.9)
Immature Grans (Abs): 0 10*3/uL (ref 0.0–0.1)
Immature Granulocytes: 0 %
Lymphocytes Absolute: 2 10*3/uL (ref 0.7–3.1)
Lymphs: 40 %
MCH: 30.1 pg (ref 26.6–33.0)
MCHC: 33.8 g/dL (ref 31.5–35.7)
MCV: 89 fL (ref 79–97)
Monocytes Absolute: 0.5 10*3/uL (ref 0.1–0.9)
Monocytes: 10 %
Neutrophils Absolute: 2.3 10*3/uL (ref 1.4–7.0)
Neutrophils: 47 %
RBC: 4.38 x10E6/uL (ref 3.77–5.28)
RDW: 13.7 % (ref 11.7–15.4)
WBC: 4.9 10*3/uL (ref 3.4–10.8)

## 2022-08-05 LAB — ALLERGEN, ANNATTO SEED
Annatto Seed IgE*: <0.35 kU/L (ref <0.35)
Class Interpretation: 0

## 2022-08-05 LAB — THYROID ANTIBODIES
Thyroglobulin Antibody: <1 IU/mL (ref 0.0–0.9)
Thyroperoxidase Ab SerPl-aCnc: <9 IU/mL (ref 0–34)

## 2022-08-05 LAB — CHRONIC URTICARIA: cu index: 4 (ref <10)

## 2022-08-05 LAB — TSH: TSH: 2 u[IU]/mL (ref 0.450–4.500)

## 2022-08-05 LAB — ALLERGEN, RED (CARMINE) DYE, RF340: F340-IgE Carmine Red Dye: 0.16 kU/L — AB

## 2022-08-05 LAB — TRYPTASE: Tryptase: 4.3 ug/L (ref 2.2–13.2)

## 2023-03-19 IMAGING — CT CT RENAL STONE PROTOCOL
3 of 4 series · 8 of 46 positions shown, 15 images · non-contrast
Comparison: None.

CLINICAL DATA: Acute right flank pain.

EXAM:
CT ABDOMEN AND PELVIS WITHOUT CONTRAST
TECHNIQUE: Multidetector CT imaging of the abdomen and pelvis was performed
following the standard protocol without IV contrast.

[Series 4: lungs · axial · 0.74mm/px · z∈[+1214,+1289]mm · 4 of 25 slices shown, 9 images]
[im 5/25  soft-tissue]
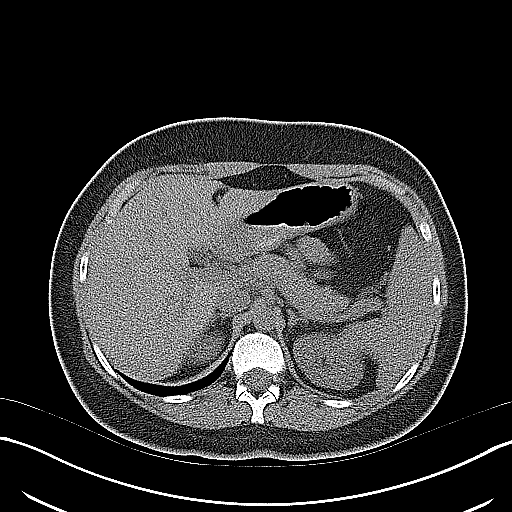
[im 5/25  lung]
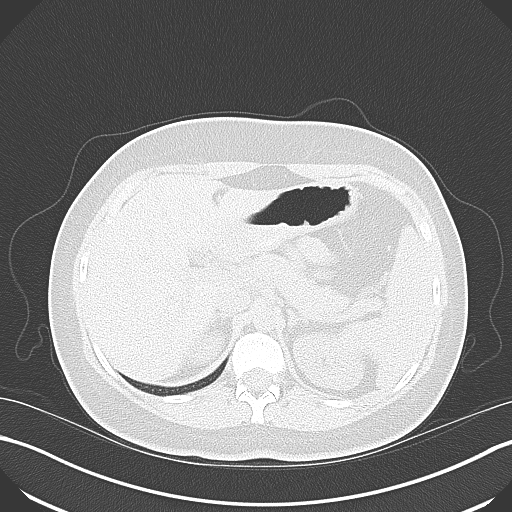
[im 5/25  bone]
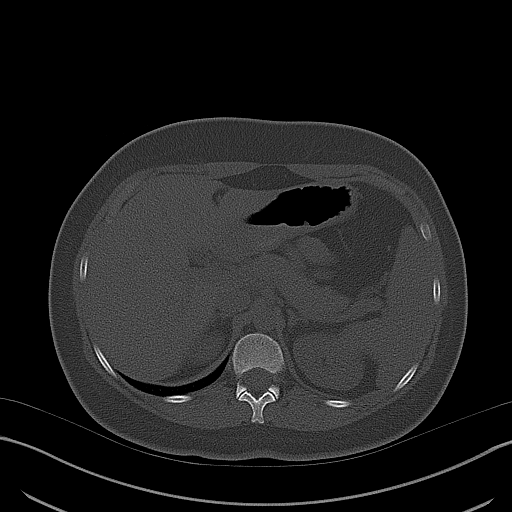
[im 10/25  soft-tissue]
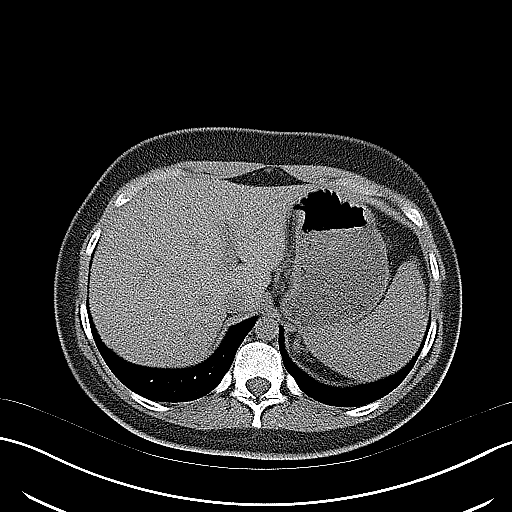
[im 10/25  lung]
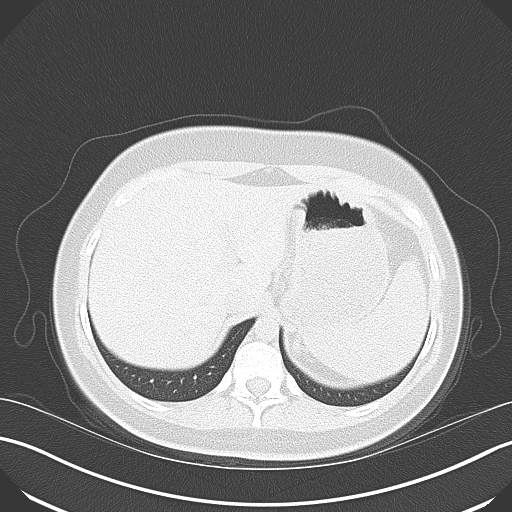
[im 15/25  soft-tissue]
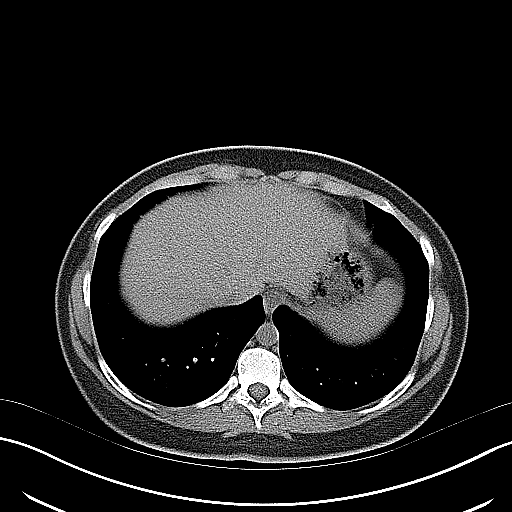
[im 15/25  lung]
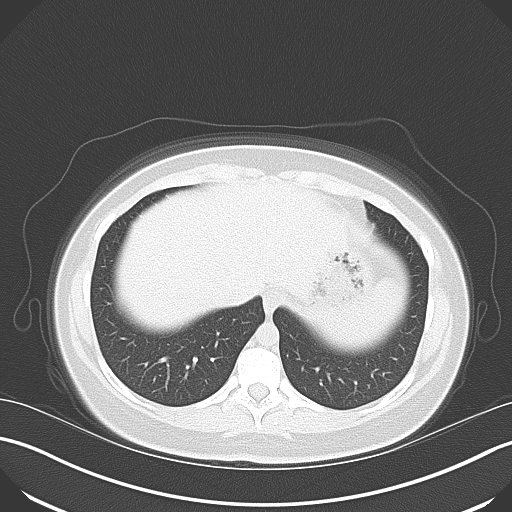
[im 20/25  soft-tissue]
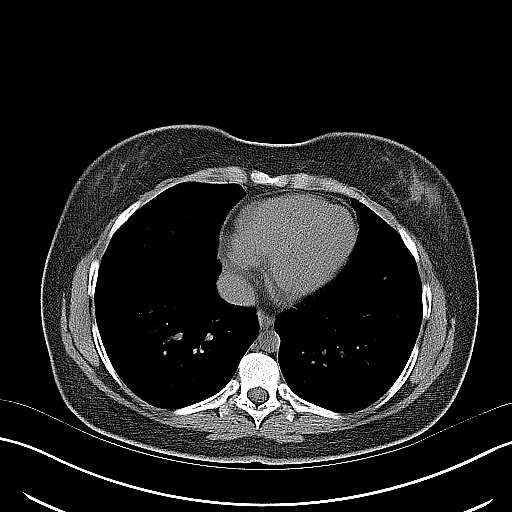
[im 20/25  lung]
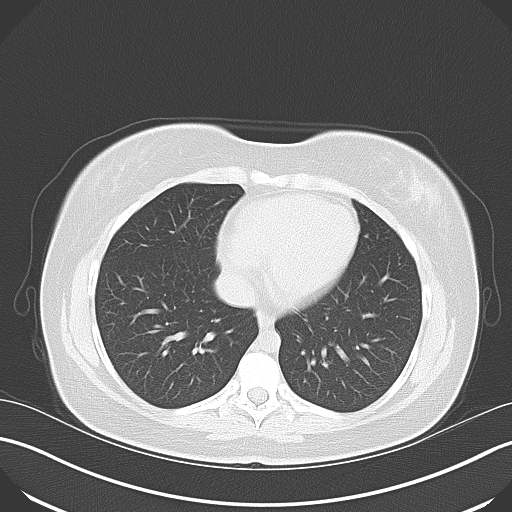

[Series 5: coronal · coronal · 0.73mm/px · 3 of 89 slices shown, 4 images]
[im 30/89  soft-tissue]
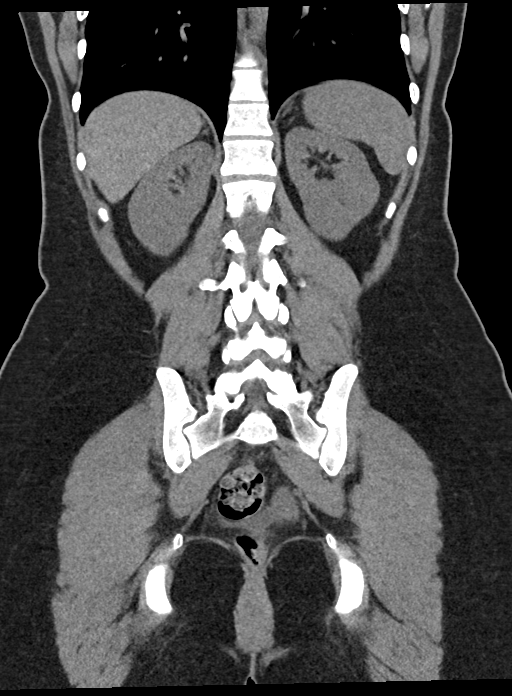
[im 40/89  soft-tissue]
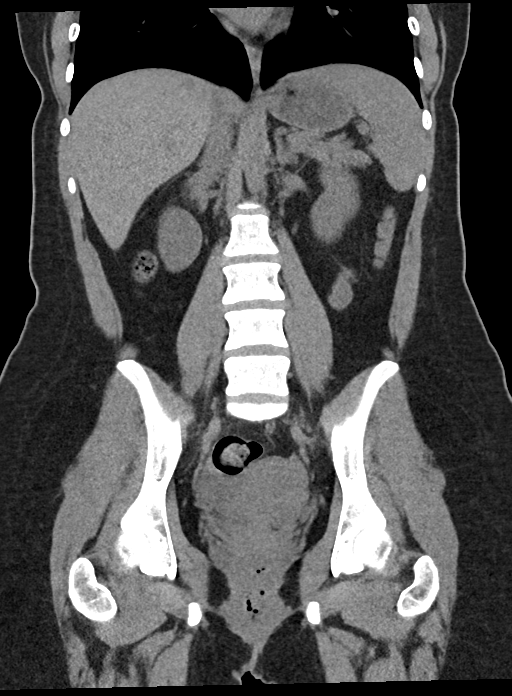
[im 40/89  bone]
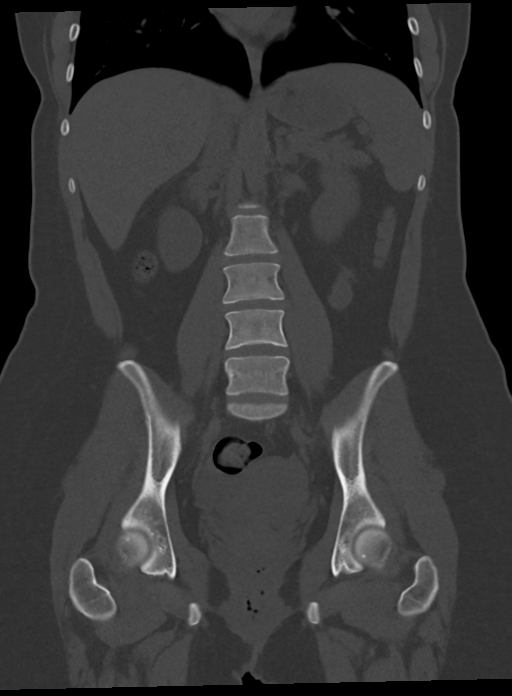
[im 49/89  soft-tissue]
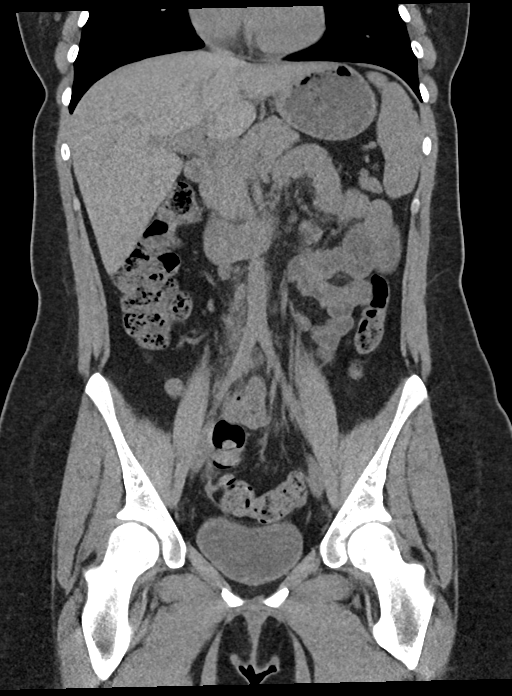

[Series 6: sagittal · sagittal · 0.52mm/px · 1 of 125 slices shown, 2 images]
[im 42/125  soft-tissue]
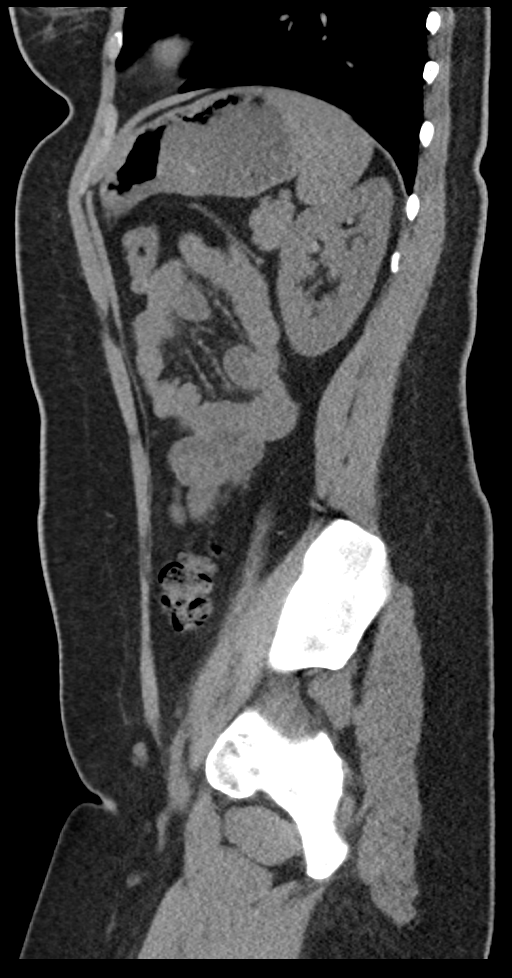
[im 42/125  bone]
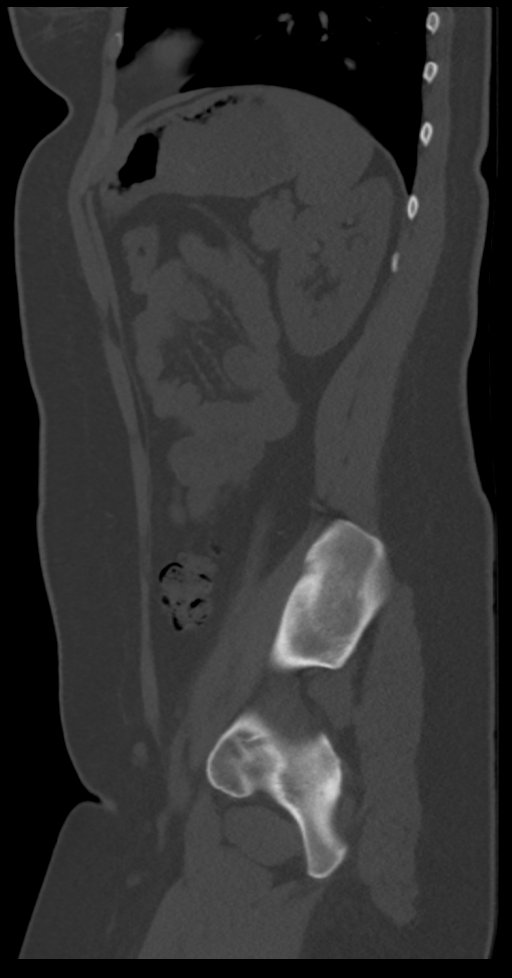

[8 of 46 positions shown; findings below may reference images not displayed]

FINDINGS: Lower chest: No acute abnormality.

Hepatobiliary: No focal liver abnormality is seen. No gallstones,
gallbladder wall thickening, or biliary dilatation.

Pancreas: Unremarkable. No pancreatic ductal dilatation or
surrounding inflammatory changes.

Spleen: Normal in size without focal abnormality.

Adrenals/Urinary Tract: Adrenal glands appear normal. Bilateral
nonobstructive nephrolithiasis is noted. No hydronephrosis or renal
obstruction is noted. Urinary bladder is unremarkable.

Stomach/Bowel: Stomach is within normal limits. Appendix appears
normal. No evidence of bowel wall thickening, distention, or
inflammatory changes.

Vascular/Lymphatic: No significant vascular findings are present. No
enlarged abdominal or pelvic lymph nodes.

Reproductive: Uterus and bilateral adnexa are unremarkable.

Other: No abdominal wall hernia or abnormality. No abdominopelvic
ascites.

Musculoskeletal: No acute or significant osseous findings.
IMPRESSION: Bilateral nonobstructive nephrolithiasis. No hydronephrosis or renal
obstruction is noted.
